# Patient Record
Sex: Male | Born: 1955 | Race: Black or African American | Hispanic: No | Marital: Single | Smoking: Current every day smoker
Health system: Southern US, Community
[De-identification: ages and names within clinical notes are randomized; demographics above are authoritative.]

## PROBLEM LIST (undated history)

## (undated) DIAGNOSIS — K4090 Unilateral inguinal hernia, without obstruction or gangrene, not specified as recurrent: Secondary | ICD-10-CM

## (undated) DIAGNOSIS — I10 Essential (primary) hypertension: Secondary | ICD-10-CM

## (undated) DIAGNOSIS — I429 Cardiomyopathy, unspecified: Secondary | ICD-10-CM

## (undated) DIAGNOSIS — Z959 Presence of cardiac and vascular implant and graft, unspecified: Secondary | ICD-10-CM

## (undated) DIAGNOSIS — Z720 Tobacco use: Secondary | ICD-10-CM

## (undated) HISTORY — PX: OTHER SURGICAL HISTORY: SHX169

---

## 2014-10-18 ENCOUNTER — Inpatient Hospital Stay (HOSPITAL_COMMUNITY)
Admission: EM | Admit: 2014-10-18 | Discharge: 2014-10-21 | DRG: 249 | Disposition: A | Discharge status: Home / Self Care | Payer: BLUE CROSS/BLUE SHIELD | Source: Other Acute Inpatient Hospital | Attending: Cardiology | Admitting: Cardiology

## 2014-10-18 ENCOUNTER — Encounter (HOSPITAL_COMMUNITY)
Admission: EM | Disposition: A | Discharge status: Home / Self Care | Payer: BLUE CROSS/BLUE SHIELD | Source: Other Acute Inpatient Hospital | Attending: Cardiology

## 2014-10-18 ENCOUNTER — Encounter (HOSPITAL_COMMUNITY): Payer: Self-pay | Admitting: Cardiology

## 2014-10-18 DIAGNOSIS — I214 Non-ST elevation (NSTEMI) myocardial infarction: Principal | ICD-10-CM | POA: Diagnosis present

## 2014-10-18 DIAGNOSIS — R079 Chest pain, unspecified: Secondary | ICD-10-CM

## 2014-10-18 DIAGNOSIS — T45515A Adverse effect of anticoagulants, initial encounter: Secondary | ICD-10-CM | POA: Diagnosis not present

## 2014-10-18 DIAGNOSIS — I2582 Chronic total occlusion of coronary artery: Secondary | ICD-10-CM | POA: Diagnosis present

## 2014-10-18 DIAGNOSIS — K4090 Unilateral inguinal hernia, without obstruction or gangrene, not specified as recurrent: Secondary | ICD-10-CM | POA: Diagnosis present

## 2014-10-18 DIAGNOSIS — I2510 Atherosclerotic heart disease of native coronary artery without angina pectoris: Secondary | ICD-10-CM | POA: Diagnosis present

## 2014-10-18 DIAGNOSIS — I10 Essential (primary) hypertension: Secondary | ICD-10-CM | POA: Diagnosis not present

## 2014-10-18 DIAGNOSIS — F17200 Nicotine dependence, unspecified, uncomplicated: Secondary | ICD-10-CM | POA: Diagnosis present

## 2014-10-18 DIAGNOSIS — I119 Hypertensive heart disease without heart failure: Secondary | ICD-10-CM | POA: Diagnosis present

## 2014-10-18 DIAGNOSIS — R0600 Dyspnea, unspecified: Secondary | ICD-10-CM | POA: Diagnosis not present

## 2014-10-18 DIAGNOSIS — I43 Cardiomyopathy in diseases classified elsewhere: Secondary | ICD-10-CM | POA: Diagnosis present

## 2014-10-18 DIAGNOSIS — Z959 Presence of cardiac and vascular implant and graft, unspecified: Secondary | ICD-10-CM

## 2014-10-18 DIAGNOSIS — Z9119 Patient's noncompliance with other medical treatment and regimen: Secondary | ICD-10-CM | POA: Diagnosis present

## 2014-10-18 DIAGNOSIS — Z8249 Family history of ischemic heart disease and other diseases of the circulatory system: Secondary | ICD-10-CM | POA: Diagnosis not present

## 2014-10-18 DIAGNOSIS — Z720 Tobacco use: Secondary | ICD-10-CM

## 2014-10-18 DIAGNOSIS — I340 Nonrheumatic mitral (valve) insufficiency: Secondary | ICD-10-CM | POA: Diagnosis not present

## 2014-10-18 DIAGNOSIS — I429 Cardiomyopathy, unspecified: Secondary | ICD-10-CM

## 2014-10-18 DIAGNOSIS — Z955 Presence of coronary angioplasty implant and graft: Secondary | ICD-10-CM

## 2014-10-18 HISTORY — DX: Unilateral inguinal hernia, without obstruction or gangrene, not specified as recurrent: K40.90

## 2014-10-18 HISTORY — DX: Essential (primary) hypertension: I10

## 2014-10-18 HISTORY — PX: LEFT HEART CATH: SHX5478

## 2014-10-18 HISTORY — DX: Tobacco use: Z72.0

## 2014-10-18 HISTORY — DX: Presence of cardiac and vascular implant and graft, unspecified: Z95.9

## 2014-10-18 HISTORY — DX: Cardiomyopathy, unspecified: I42.9

## 2014-10-18 LAB — CBC
HCT: 51 % (ref 39.0–52.0)
HEMOGLOBIN: 16.5 g/dL (ref 13.0–17.0)
MCH: 26.8 pg (ref 26.0–34.0)
MCHC: 32.4 g/dL (ref 30.0–36.0)
MCV: 82.9 fL (ref 78.0–100.0)
Platelets: 212 10*3/uL (ref 150–400)
RBC: 6.15 MIL/uL — ABNORMAL HIGH (ref 4.22–5.81)
RDW: 15.3 % (ref 11.5–15.5)
WBC: 12.9 10*3/uL — AB (ref 4.0–10.5)

## 2014-10-18 LAB — CREATININE, SERUM
CREATININE: 1.32 mg/dL (ref 0.50–1.35)
GFR calc non Af Amer: 58 mL/min — ABNORMAL LOW (ref >90)
GFR, EST AFRICAN AMERICAN: 67 mL/min — AB (ref >90)

## 2014-10-18 LAB — MRSA PCR SCREENING: MRSA by PCR: NEGATIVE

## 2014-10-18 LAB — MAGNESIUM: Magnesium: 1.8 mg/dL (ref 1.5–2.5)

## 2014-10-18 LAB — TSH: TSH: 2.106 u[IU]/mL (ref 0.350–4.500)

## 2014-10-18 SURGERY — LEFT HEART CATH

## 2014-10-18 MED ORDER — MORPHINE SULFATE 2 MG/ML IJ SOLN
INTRAMUSCULAR | Status: AC
  Filled 2014-10-18: qty 1

## 2014-10-18 MED ORDER — SODIUM CHLORIDE 0.9 % IV SOLN
INTRAVENOUS | Status: AC
  Administered 2014-10-18: 20:00:00 via INTRAVENOUS

## 2014-10-18 MED ORDER — BIVALIRUDIN 250 MG IV SOLR
INTRAVENOUS | Status: AC
  Filled 2014-10-18: qty 250

## 2014-10-18 MED ORDER — FENTANYL CITRATE (PF) 100 MCG/2ML IJ SOLN
INTRAMUSCULAR | Status: AC
  Filled 2014-10-18: qty 2

## 2014-10-18 MED ORDER — ASPIRIN 81 MG PO CHEW
81.0000 mg | CHEWABLE_TABLET | Freq: Every day | ORAL | Status: DC
  Administered 2014-10-19 – 2014-10-21 (×3): 81 mg via ORAL
  Filled 2014-10-18 (×3): qty 1

## 2014-10-18 MED ORDER — HEPARIN (PORCINE) IN NACL 2-0.9 UNIT/ML-% IJ SOLN
INTRAMUSCULAR | Status: AC
  Filled 2014-10-18: qty 1000

## 2014-10-18 MED ORDER — FUROSEMIDE 10 MG/ML IJ SOLN
40.0000 mg | Freq: Two times a day (BID) | INTRAMUSCULAR | Status: DC
  Administered 2014-10-18: 40 mg via INTRAVENOUS
  Filled 2014-10-18 (×4): qty 4

## 2014-10-18 MED ORDER — TICAGRELOR 90 MG PO TABS
ORAL_TABLET | ORAL | Status: AC
  Filled 2014-10-18: qty 1

## 2014-10-18 MED ORDER — NITROGLYCERIN IN D5W 200-5 MCG/ML-% IV SOLN
0.0000 ug/min | INTRAVENOUS | Status: DC
Start: 2014-10-19 — End: 2014-10-20
  Administered 2014-10-19: 5 ug/min via INTRAVENOUS

## 2014-10-18 MED ORDER — ALPRAZOLAM 0.5 MG PO TABS
0.5000 mg | ORAL_TABLET | Freq: Once | ORAL | Status: AC
  Administered 2014-10-19: 0.5 mg via ORAL
  Filled 2014-10-18: qty 1

## 2014-10-18 MED ORDER — MIDAZOLAM HCL 2 MG/2ML IJ SOLN
INTRAMUSCULAR | Status: AC
  Filled 2014-10-18: qty 2

## 2014-10-18 MED ORDER — HEPARIN SODIUM (PORCINE) 5000 UNIT/ML IJ SOLN
5000.0000 [IU] | Freq: Three times a day (TID) | INTRAMUSCULAR | Status: DC
  Administered 2014-10-19 – 2014-10-21 (×7): 5000 [IU] via SUBCUTANEOUS
  Filled 2014-10-18 (×10): qty 1

## 2014-10-18 MED ORDER — MORPHINE SULFATE 2 MG/ML IJ SOLN
2.0000 mg | Freq: Once | INTRAMUSCULAR | Status: AC
  Administered 2014-10-18: 2 mg via INTRAVENOUS

## 2014-10-18 MED ORDER — LIDOCAINE HCL (PF) 1 % IJ SOLN
INTRAMUSCULAR | Status: AC
  Filled 2014-10-18: qty 30

## 2014-10-18 MED ORDER — ONDANSETRON HCL 4 MG/2ML IJ SOLN: 4.0000 mg | Freq: Four times a day (QID) | INTRAMUSCULAR | Status: DC | PRN

## 2014-10-18 MED ORDER — TICAGRELOR 90 MG PO TABS
90.0000 mg | ORAL_TABLET | Freq: Two times a day (BID) | ORAL | Status: DC
  Administered 2014-10-18 – 2014-10-20 (×4): 90 mg via ORAL
  Filled 2014-10-18 (×5): qty 1

## 2014-10-18 MED ORDER — POTASSIUM CHLORIDE CRYS ER 20 MEQ PO TBCR
20.0000 meq | EXTENDED_RELEASE_TABLET | Freq: Once | ORAL | Status: AC
  Administered 2014-10-18: 20 meq via ORAL
  Filled 2014-10-18: qty 1

## 2014-10-18 MED ORDER — MORPHINE SULFATE 2 MG/ML IJ SOLN
2.0000 mg | INTRAMUSCULAR | Status: DC | PRN
Start: 2014-10-18 — End: 2014-10-21
  Administered 2014-10-18 – 2014-10-19 (×5): 2 mg via INTRAVENOUS
  Administered 2014-10-19: 4 mg via INTRAVENOUS
  Administered 2014-10-20 – 2014-10-21 (×4): 2 mg via INTRAVENOUS
  Filled 2014-10-18: qty 2
  Filled 2014-10-18 (×9): qty 1

## 2014-10-18 MED ORDER — VERAPAMIL HCL 2.5 MG/ML IV SOLN
INTRAVENOUS | Status: AC
  Filled 2014-10-18: qty 2

## 2014-10-18 MED ORDER — HYDRALAZINE HCL 25 MG PO TABS
25.0000 mg | ORAL_TABLET | Freq: Three times a day (TID) | ORAL | Status: DC
  Administered 2014-10-18 – 2014-10-21 (×8): 25 mg via ORAL
  Filled 2014-10-18 (×12): qty 1

## 2014-10-18 MED ORDER — ACETAMINOPHEN 325 MG PO TABS
650.0000 mg | ORAL_TABLET | ORAL | Status: DC | PRN
  Administered 2014-10-18 – 2014-10-21 (×2): 650 mg via ORAL
  Filled 2014-10-18 (×2): qty 2

## 2014-10-18 MED ORDER — CARVEDILOL 3.125 MG PO TABS
3.1250 mg | ORAL_TABLET | Freq: Two times a day (BID) | ORAL | Status: DC
  Administered 2014-10-18 – 2014-10-19 (×2): 3.125 mg via ORAL
  Filled 2014-10-18 (×4): qty 1

## 2014-10-18 MED ORDER — NICOTINE 14 MG/24HR TD PT24
14.0000 mg | MEDICATED_PATCH | Freq: Every day | TRANSDERMAL | Status: DC
  Administered 2014-10-18 – 2014-10-21 (×4): 14 mg via TRANSDERMAL
  Filled 2014-10-18 (×4): qty 1

## 2014-10-18 MED ORDER — NITROGLYCERIN 0.4 MG SL SUBL
SUBLINGUAL_TABLET | SUBLINGUAL | Status: AC
  Filled 2014-10-18: qty 1

## 2014-10-18 MED ORDER — NITROGLYCERIN 0.4 MG SL SUBL
0.4000 mg | SUBLINGUAL_TABLET | SUBLINGUAL | Status: DC | PRN
  Administered 2014-10-18 (×2): 0.4 mg via SUBLINGUAL
  Filled 2014-10-18: qty 1

## 2014-10-18 MED ORDER — NITROGLYCERIN 1 MG/10 ML FOR IR/CATH LAB
INTRA_ARTERIAL | Status: AC
  Filled 2014-10-18: qty 10

## 2014-10-18 MED ORDER — ATORVASTATIN CALCIUM 80 MG PO TABS
80.0000 mg | ORAL_TABLET | Freq: Every day | ORAL | Status: DC
  Administered 2014-10-19 – 2014-10-20 (×2): 80 mg via ORAL
  Filled 2014-10-18 (×3): qty 1

## 2014-10-18 MED ORDER — ISOSORBIDE MONONITRATE ER 30 MG PO TB24
30.0000 mg | ORAL_TABLET | Freq: Every day | ORAL | Status: DC
  Administered 2014-10-18: 30 mg via ORAL
  Filled 2014-10-18 (×2): qty 1

## 2014-10-18 NOTE — CV Procedure (Signed)
    Cardiac Catheterization Procedure Note  Name: [place] ConstableShelby Gohlke MRN: [id number] DOB: 09/20/1955  Procedure: Left Heart Cath, Selective Coronary Angiography, LV angiography, PTCA and stenting of the mid RCA  Indication: 59 yo BM [facility] with acute chest pain and NSTEMI (originally called a STEMI by ED). Patient has a history of tobacco abuse and untreated HTN.  Procedural Details:  The right wrist was prepped, draped, and anesthetized with 1% lidocaine. Using the modified Seldinger technique, a 6 French slender sheath was introduced into the right radial artery. 3 mg of verapamil was administered through the sheath, weight-based unfractionated heparin was administered intravenously. Standard Judkins catheters were used for selective coronary angiography and left ventriculography. Catheter exchanges were performed over an exchange length guidewire.  PROCEDURAL FINDINGS Hemodynamics: AO 151/103 mean 125 mm Hg LV 143/37 mm Hg   Coronary angiography: Coronary dominance: right  Left mainstem: 20% diffuse disease.   Left anterior descending (LAD): There is 20-30% disease in the proximal vessel. The first diagonal has a high take off and is occluded with late left to left collaterals.   Left circumflex (LCx): Diffuse irregularities in the proximal to mid vessel up to 30%.   Right coronary artery (RCA): Dominant vessel. There is a segmental 95% stenosis in the mid vessel with TIMI2 flow.   Left ventriculography: Left ventricular systolic function is abnormal. There is akinesis in the mid anterior and basal to mid inferior wall. There is severe global hypokinesis.  LVEF is estimated at 25%, there is no significant mitral regurgitation   PCI Note:  Following the diagnostic procedure, the decision was made to proceed with PCI of the RCA.  Weight-based bivalirudin was given for anticoagulation. Brilinta 180 mg was given orally. Once a therapeutic ACT was achieved, a 6 [place]French  FR4 guide catheter was inserted.  A prowater coronary guidewire was used to cross the lesion.  The lesion was predilated with a 2.5 mm balloon.  The lesion was then stented with a 3.0 x 23 mm Vision stent.  The stent was postdilated with a 3.25 mm noncompliant balloon.  Following PCI, there was 0% residual stenosis and TIMI-3 flow. Final angiography confirmed an excellent result. The patient tolerated the procedure well. There were no immediate procedural complications. A TR band was used for radial hemostasis. The patient was transferred to the post catheterization recovery area for further monitoring.  PCI Data: Vessel - RCA/Segment - mid  Percent Stenosis (pre)  95% TIMI-flow 2 Stent 3.0 x 23 mm Vision Percent Stenosis (post) 0% TIMI-flow (post) 3  Final Conclusions:   1. 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 There is an acute lesion in the mid RCA. 2. Severe LV dysfunction. LV dysfunction is out of proportion to his CAD and suggests a component of hypertensive cardiomyopathy. 3. Successful stenting of the mid RCA with BMS.   Recommendations:  DAPT for one year. Needs aggressive risk factor modification and BP control. Will start beta blocker, diuretics, hydralazine and nitrates. Hold ACEi until renal function stable.   Peter SwazilandJordan, MDFACC 10/18/2014, 7:15 PM

## 2014-10-18 NOTE — Progress Notes (Signed)
Pt's daughter's number is [phone] --Tildon HuskyShakera

## 2014-10-18 NOTE — Progress Notes (Addendum)
Brief X-Cover Note  Mr Steve Jackson with CP - resolved with SL NTG and 2 mg Morphine. ECG reviewed and stable with RAD and lateral ST depression. Will continue to follow. Discussed with Mr. Steve Jackson in person. Vitals reviewed; BP 136/87 mmHg  Pulse 78  Temp(Src) 98.3 F (36.8 C) (Oral)  Resp 23  Ht 5\' 9"  (1.753 m)  Wt 94.1 kg (207 lb 7.3 oz)  BMI 30.62 kg/m2  SpO2 95%RRR, Lungs fairly CTAB.   Leeann MustJacob Gerry Heaphy, MD  Mr. Steve Jackson has had two repeat episodes of CP - second episode shortly after first that resolved with NTG gtt, morphine and sleep. He woke up at 5:xx with some chest discomfort, repeat ECG unchanged, improved with morphine, NTG. Will discuss with oncoming team consideration for re-look cath based on cath film review.   Leeann MustJacob Afua Hoots, MD

## 2014-10-18 NOTE — H&P (Signed)
         Steve ConstableShelby Jackson is an 59 y.o. male.    Primary Cardiologist:: [facility] Dr. SwazilandJordan No PCP Per Patient  Chief Complaint: chest pain HPI:  59 year old male with hx of HTN not taking meds due to side effects + tobacco use and no prior cardiac disease [facility] 3 days of intermittent chest pain. Pain lat ant chest pain.   Today the pain was worse no nausea, SOB or diaphoresis.   EKG was mildly abnormal.  [facility].      Past Medical History  Diagnosis Date  . HTN (hypertension) 10/18/2014  . Tobacco use 10/18/2014  . Hernia, inguinal 10/18/2014    Past Surgical History  Procedure Laterality Date  . Inguinla hernia      repair on Rt.    Family History  Problem Relation Age of Onset  . Heart disease Mother    Social History:  reports that he has been smoking.  He has never used smokeless tobacco. He reports that he does not drink alcohol or use illicit drugs.  Allergies: Not on File  No prescriptions prior to admission  NO OUTPATIENT MEDICATIONS:  No results found for this or any previous visit (from the past 48 hour(s)). No results found.  ROS: General:no colds or fevers, no weight changes Skin:no rashes or ulcers HEENT:no blurred vision, no congestion CV:see HPI PUL:see HPI GI:no diarrhea constipation or melena, no indigestion, + inguinal hernia GU:no hematuria, no dysuria MS:no joint pain, no claudication Neuro:no syncope, no lightheadedness Endo:no diabetes, no thyroid disease   There were no vitals taken for this visit. PE: General:Pleasant affect, NAD, anxious Skin:Warm and dry, brisk capillary refill HEENT:normocephalic, sclera injected, mucus membranes moist Neck:supple, no JVD, no bruits  Heart:S1S2 RRR without murmur, gallup, rub or click Lungs:clear, ant.  without rales, rhonchi, or wheezes WUJ:WJXBAbd:soft, non tender, + BS, do not palpate liver spleen or masses, + inguinal hernia Ext:no lower  ext edema, 2+ pedal pulses, 2+ radial pulses, large Lt inguinal hernia into thigh.  Hx rt ing. Hernia repair.  Neuro:alert and oriented, MAE, follows commands, + facial symmetry    Assessment/Plan Principal Problem:   NSTEMI- urgent cath with ongoing pain  Active Problems:   HTN (hypertension)   Tobacco use   Hernia, inguinal- Lt Abnormal LFTs     End[facility] Services PaNGOLD,LAURA R Nurse Practitioner Certified [facility] Pager [phone] or after 5pm or weekends call [phone] 10/18/2014, 7:16 PM     Patient seen and examined and history reviewed. Agree with above findings and plan. 59 yo BM [facility] for what the ED called a STEMI. Review of Ecg showed more LVH but troponin came back at 23. Patient has been having chest pain off and on for 3 days. Developed severe pain at work this am. Now pain free. Has a history of untreated HTN and tobacco abuse. Does not go to a doctor. On exam he has a very large left inguinal hernia extending into scrotum. Lungs clear anterior. Positive S4. Labs remarkable for elevated creatinine of 1.46 and elevated transaminases. Will proceed with emergent cardiac cath with possible PCI. If PCI needed will plan to use BMS due to compliance issues. Recommend statin, BP control, and tobacco cessation.  Steve Jackson, MDFACC 10/18/2014 7:37 PM

## 2014-10-18 NOTE — Progress Notes (Signed)
CP returned worse than before. Pain worse with inspiration. 7/10 with pain radiating into L shoulder. Pt clammy. Repeat EKG completed. MD notified. New orders received. Will continue to monitor.

## 2014-10-19 ENCOUNTER — Encounter (HOSPITAL_COMMUNITY): Payer: Self-pay | Admitting: Cardiology

## 2014-10-19 DIAGNOSIS — I340 Nonrheumatic mitral (valve) insufficiency: Secondary | ICD-10-CM

## 2014-10-19 LAB — BASIC METABOLIC PANEL
Anion gap: 10 (ref 5–15)
BUN: 17 mg/dL (ref 6–23)
CO2: 25 mmol/L (ref 19–32)
CREATININE: 1.3 mg/dL (ref 0.50–1.35)
Calcium: 8.3 mg/dL — ABNORMAL LOW (ref 8.4–10.5)
Chloride: 101 mmol/L (ref 96–112)
GFR calc Af Amer: 68 mL/min — ABNORMAL LOW (ref >90)
GFR, EST NON AFRICAN AMERICAN: 59 mL/min — AB (ref >90)
Glucose, Bld: 122 mg/dL — ABNORMAL HIGH (ref 70–99)
Potassium: 4.8 mmol/L (ref 3.5–5.1)
Sodium: 136 mmol/L (ref 135–145)

## 2014-10-19 LAB — CBC
HEMATOCRIT: 51.7 % (ref 39.0–52.0)
Hemoglobin: 16.9 g/dL (ref 13.0–17.0)
MCH: 27.7 pg (ref 26.0–34.0)
MCHC: 32.7 g/dL (ref 30.0–36.0)
MCV: 84.6 fL (ref 78.0–100.0)
PLATELETS: 198 10*3/uL (ref 150–400)
RBC: 6.11 MIL/uL — ABNORMAL HIGH (ref 4.22–5.81)
RDW: 15.6 % — ABNORMAL HIGH (ref 11.5–15.5)
WBC: 11.7 10*3/uL — ABNORMAL HIGH (ref 4.0–10.5)

## 2014-10-19 LAB — HEPATIC FUNCTION PANEL
ALK PHOS: 68 U/L (ref 39–117)
ALT: 31 U/L (ref 0–53)
AST: 89 U/L — ABNORMAL HIGH (ref 0–37)
Albumin: 2.4 g/dL — ABNORMAL LOW (ref 3.5–5.2)
Bilirubin, Direct: 0.5 mg/dL (ref 0.0–0.5)
Indirect Bilirubin: 1.4 mg/dL — ABNORMAL HIGH (ref 0.3–0.9)
TOTAL PROTEIN: 6.9 g/dL (ref 6.0–8.3)
Total Bilirubin: 1.9 mg/dL — ABNORMAL HIGH (ref 0.3–1.2)

## 2014-10-19 LAB — POCT ACTIVATED CLOTTING TIME: Activated Clotting Time: 331 seconds

## 2014-10-19 MED ORDER — TRAMADOL HCL 50 MG PO TABS
50.0000 mg | ORAL_TABLET | Freq: Four times a day (QID) | ORAL | Status: DC | PRN
  Administered 2014-10-19 (×2): 50 mg via ORAL
  Filled 2014-10-19 (×2): qty 1

## 2014-10-19 MED ORDER — SODIUM CHLORIDE 0.9 % IV SOLN: INTRAVENOUS | Status: DC

## 2014-10-19 MED ORDER — FUROSEMIDE 40 MG PO TABS
40.0000 mg | ORAL_TABLET | Freq: Two times a day (BID) | ORAL | Status: DC
  Administered 2014-10-19 (×2): 40 mg via ORAL
  Filled 2014-10-19 (×4): qty 1

## 2014-10-19 MED ORDER — LOSARTAN POTASSIUM 25 MG PO TABS
25.0000 mg | ORAL_TABLET | Freq: Every day | ORAL | Status: DC
  Administered 2014-10-19 – 2014-10-21 (×3): 25 mg via ORAL
  Filled 2014-10-19 (×3): qty 1

## 2014-10-19 MED ORDER — ISOSORBIDE MONONITRATE ER 60 MG PO TB24
60.0000 mg | ORAL_TABLET | Freq: Every day | ORAL | Status: DC
  Administered 2014-10-19 – 2014-10-21 (×3): 60 mg via ORAL
  Filled 2014-10-19 (×3): qty 1

## 2014-10-19 MED ORDER — NITROGLYCERIN IN D5W 200-5 MCG/ML-% IV SOLN
INTRAVENOUS | Status: AC
  Filled 2014-10-19: qty 250

## 2014-10-19 MED ORDER — CARVEDILOL 6.25 MG PO TABS
6.2500 mg | ORAL_TABLET | Freq: Two times a day (BID) | ORAL | Status: DC
  Administered 2014-10-19 – 2014-10-21 (×4): 6.25 mg via ORAL
  Filled 2014-10-19 (×6): qty 1

## 2014-10-19 MED FILL — Sodium Chloride IV Soln 0.9%: INTRAVENOUS | Qty: 50 | Status: AC

## 2014-10-19 NOTE — Care Management Note (Signed)
    Page 1 of 1   10/19/2014     10:07:36 AM CARE MANAGEMENT NOTE 10/19/2014  Patient:  Steve Jackson,Steve Jackson   Account Number:  [id number]  Date Initiated:  10/19/2014  Documentation initiated by:  Junius CreamerWELL,DEBBIE  Subjective/Objective Assessment:   adm w nstemi     Action/Plan:   lives w fam   Anticipated DC Date:     Anticipated DC Plan:  HOME/SELF CARE      DC Planning Services  CM consult  Medication Assistance      Choice offered to / List presented to:             Status of service:   Medicare Important Message given?   (If response is "NO", the following Medicare IM given date fields will be blank) Date Medicare IM given:   Medicare IM given by:   Date Additional Medicare IM given:   Additional Medicare IM given by:    Discharge Disposition:    Per UR Regulation:  Reviewed for med. necessity/level of care/duration of stay  If discussed at Long Length of Stay Meetings, dates discussed:    Comments:  4/26 1006 debbie Don Tiu rn,bsn spoke w pt and gave him 30day free and copay assist card for brilinta. bcbs listed as ins.

## 2014-10-19 NOTE — Progress Notes (Signed)
Patient Name: Steve ConstableShelby Jackson Date of Encounter: 10/19/2014     Principal Problem:   NSTEMI (non-ST elevated myocardial infarction) Active Problems:   HTN (hypertension)   Tobacco use   Hernia, inguinal   Cardiomyopathy, EF 25% at cath   S/P arterial stent, BMS to RCA, 10/18/14   CAD, multiple vessel    SUBJECTIVE  Patient had chest pain during the night which improved after IV morphine and increased IV nitroglycerin drip and alprazolam.  This morning he is free of chest pain except point he takes a deep breath.  Blood pressure is normal and he is not short of breath.  [facility] trop[facility].  CURRENT MEDS . aspirin  81 mg Oral Daily  . atorvastatin  80 mg Oral q1800  . carvedilol  3.125 mg Oral BID WC  . heparin  5,000 Units Subcutaneous 3 times per day  . hydrALAZINE  25 mg Oral 3 times per day  . isosorbide mononitrate  60 mg Oral Daily  . nicotine  14 mg Transdermal Daily  . ticagrelor  90 mg Oral BID    OBJECTIVE  Filed Vitals:   10/19/14 0630 10/19/14 0700 10/19/14 0715 10/19/14 0730  BP: 120/78 121/76 128/90 126/74  Pulse: 81 81 85 80  Temp:      TempSrc:      Resp: 26 24 22 31   Height:      Weight:      SpO2: 95% 92% 95% 94%    Intake/Output Summary (Last 24 hours) at 10/19/14 0740 Last data filed at 10/19/14 0600  Gross per 24 hour  Intake 786.13 ml  Output   2050 ml  Net -1263.87 ml   Filed Weights   10/18/14 2000  Weight: 207 lb 7.3 oz (94.1 kg)    PHYSICAL EXAM  General: Pleasant, NAD. Neuro: Alert and oriented X 3. Moves all extremities spontaneously. Psych: Normal affect. HEENT:  Normal  Neck: Supple without bruits or JVD. Lungs:  Resp regular and unlabored, CTA. Heart: RRR no s3, s4, or murmurs. Abdomen: Soft, non-tender, non-distended, BS + x 4.  Extremities: No clubbing, cyanosis or edema. DP/PT/Radials 2+ and equal bilaterally.  Accessory Clinical Findings  CBC  Recent Labs  10/18/14 2053 10/19/14 0222    WBC 12.9* 11.7*  HGB 16.5 16.9  HCT 51.0 51.7  MCV 82.9 84.6  PLT 212 198   Basic Metabolic Panel  Recent Labs  10/18/14 2053 10/19/14 0222  NA  --  136  K  --  4.8  CL  --  101  CO2  --  25  GLUCOSE  --  122*  BUN  --  17  CREATININE 1.32 1.30  CALCIUM  --  8.3*  MG 1.8  --    Liver Function Tests  Recent Labs  10/19/14 0222  AST 89*  ALT 31  ALKPHOS 68  BILITOT 1.9*  PROT 6.9  ALBUMIN 2.4*   No results for input(s): LIPASE, AMYLASE in the last 72 hours. Cardiac Enzymes No results for input(s): CKTOTAL, CKMB, CKMBINDEX, TROPONINI in the last 72 hours. BNP Invalid input(s): POCBNP D-Dimer No results for input(s): DDIMER in the last 72 hours. Hemoglobin A1C No results for input(s): HGBA1C in the last 72 hours. Fasting Lipid Panel No results for input(s): CHOL, HDL, LDLCALC, TRIG, CHOLHDL, LDLDIRECT in the last 72 hours. Thyroid Function Tests  Recent Labs  10/18/14 2053  TSH 2.106    TELE  Normal sinus rhythm  ECG  19-Oct-2014 05:11:21 [facility] ROUTINE RECORD Sinus rhythm with short PR Rightward axis Cannot rule out Inferior infarct , age undetermined T wave abnormality, consider lateral ischemia Abnormal ECG Personally reviewed Radiology/Studies  No results found.  ASSESSMENT AND PLAN  1.  Non-STEMI with significant troponin elevation of 23, status post stenting of the mid" artery with a bare metal stent. 2.  Left ventricular systolic dysfunction which is out of proportion to his coronary disease and suggests a component of hypertensive cardiomyopathy. 3.  History of poor compliance in the past  Plan: We will try to wean his IV nitroglycerin today and put him on oral nitrates.  We will switch him to oral Lasix.  We will add losartan 25 mg daily. We will increase activity today.  He will be getting an echocardiogram today.  Anticipate possible discharge Wednesday if stable.  Signed, Cassell Clement MD

## 2014-10-19 NOTE — Progress Notes (Signed)
Pt c/o of worsening CP 6/10. Hurts with inspiration. Pt restless and clammy. Repeat EKG. MD notified. New orders received. MD coming to bedside. Will continue to monior.

## 2014-10-19 NOTE — Progress Notes (Signed)
Pt complaining of chest pain throughout the day only during deep inspiration. Morphine prn given this AM and MD notified MD stated thinks this is respiratory and not cardiac and ordered to continue weaning nitro gtt. MD at bedside dueing AM rounds and ordered Tramadol 50 mg PO to ease respiratory pain. Tramadol given to pt some ease of pain. Complained of more pain throughout the day Morphine given, repositioned, and MD aware. Morphine resolved pain at this time. Will continue to assess and monitor the pt closely.

## 2014-10-19 NOTE — Progress Notes (Signed)
CARDIAC REHAB PHASE I   PRE:  Rate/Rhythm: 85 SR  BP:  Sitting: 126/84        SaO2: 90 RA  MODE:  Ambulation: 350 ft   POST:  Rate/Rhythm: 86 SR  BP:  Sitting: 133/83         SaO2: 95 4L  Pt ambulated 350 ft on 4L O2, IV, handheld assist, steady gait, tolerated fair.  Pt c/o of moderate DOE, denies cp, dizziness, declined rest stop. Began MI/stent education.  Gave pt MI book. Reviewed anti-platelet therapy, activity restrictions, gave pt heart healthy diet, tobacco cessation, sodium restriction handout and heart failure book. Pt verbalized understanding. Pt states he will quit smoking, even though his girlfriend smokes.  Pt does state he drinks "a 40 of beer" each day. Pt advised to severely limit alcohol intake/quit using alcohol. Pt sitting on side of bed during education, O2 88-91 on 4L O2. Pt states "it hurts to take a deep breath." Pt c/o 2/10 chest pain. RN notified. Pt sitting in bed with call bell within reach.  10:15-11:15   Joylene GrapesMonge, Chyanna Flock C, RN, BSN 10/19/2014 11:10 AM

## 2014-10-19 NOTE — Progress Notes (Signed)
Pt awoke with worsening CP. States it is the worst it has been all night 8-9/10. States it is sharp pain with inhalation. EKG completed. MD notified. New orders received. Will continue to monitor.

## 2014-10-19 NOTE — Progress Notes (Signed)
  Echocardiogram 2D Echocardiogram has been performed.  Steve Jackson, Steve Jackson A 10/19/2014, 4:26 PM

## 2014-10-20 ENCOUNTER — Inpatient Hospital Stay (HOSPITAL_COMMUNITY): Payer: BLUE CROSS/BLUE SHIELD

## 2014-10-20 LAB — BASIC METABOLIC PANEL
Anion gap: 11 (ref 5–15)
BUN: 15 mg/dL (ref 6–23)
CHLORIDE: 97 mmol/L (ref 96–112)
CO2: 27 mmol/L (ref 19–32)
CREATININE: 1.32 mg/dL (ref 0.50–1.35)
Calcium: 8.3 mg/dL — ABNORMAL LOW (ref 8.4–10.5)
GFR calc Af Amer: 67 mL/min — ABNORMAL LOW (ref >90)
GFR calc non Af Amer: 58 mL/min — ABNORMAL LOW (ref >90)
Glucose, Bld: 91 mg/dL (ref 70–99)
Potassium: 3.5 mmol/L (ref 3.5–5.1)
Sodium: 135 mmol/L (ref 135–145)

## 2014-10-20 LAB — HEMOGLOBIN A1C
Hgb A1c MFr Bld: 5.6 % (ref 4.8–5.6)
MEAN PLASMA GLUCOSE: 114 mg/dL

## 2014-10-20 MED ORDER — GI COCKTAIL ~~LOC~~
30.0000 mL | Freq: Once | ORAL | Status: AC
  Administered 2014-10-20: 30 mL via ORAL
  Filled 2014-10-20: qty 30

## 2014-10-20 MED ORDER — FUROSEMIDE 40 MG PO TABS
40.0000 mg | ORAL_TABLET | Freq: Every day | ORAL | Status: DC
  Administered 2014-10-20: 40 mg via ORAL
  Filled 2014-10-20: qty 1

## 2014-10-20 MED ORDER — CLOPIDOGREL BISULFATE 75 MG PO TABS
75.0000 mg | ORAL_TABLET | Freq: Every day | ORAL | Status: DC
  Administered 2014-10-21: 75 mg via ORAL
  Filled 2014-10-20: qty 1

## 2014-10-20 MED ORDER — PANTOPRAZOLE SODIUM 40 MG PO TBEC
40.0000 mg | DELAYED_RELEASE_TABLET | Freq: Every day | ORAL | Status: DC
  Administered 2014-10-20: 40 mg via ORAL
  Filled 2014-10-20: qty 1

## 2014-10-20 MED ORDER — PNEUMOCOCCAL VAC POLYVALENT 25 MCG/0.5ML IJ INJ
0.5000 mL | INJECTION | INTRAMUSCULAR | Status: AC
  Administered 2014-10-21: 0.5 mL via INTRAMUSCULAR
  Filled 2014-10-20: qty 0.5

## 2014-10-20 MED ORDER — POTASSIUM CHLORIDE CRYS ER 20 MEQ PO TBCR
20.0000 meq | EXTENDED_RELEASE_TABLET | Freq: Once | ORAL | Status: AC
  Administered 2014-10-20: 20 meq via ORAL
  Filled 2014-10-20: qty 1

## 2014-10-20 MED ORDER — CLOPIDOGREL BISULFATE 300 MG PO TABS
300.0000 mg | ORAL_TABLET | Freq: Once | ORAL | Status: AC
  Administered 2014-10-20: 300 mg via ORAL
  Filled 2014-10-20: qty 1

## 2014-10-20 NOTE — Progress Notes (Addendum)
CARDIAC REHAB PHASE I   PRE:  Rate/Rhythm:79 SR  BP:  Sitting: 104/62        SaO2: 98 4L  MODE:  Ambulation: 350 ft   POST:  Rate/Rhythm: 78 SR  BP:  Sitting: 78 SR         SaO2: 98 2L  Pt ambulated 350 ft on 2 L O2, handheld assist, steady gait, tolerated fair.  Pt c/o of mild DOE, denies cp, does c/o L shoulder pain 4/10 aching, denies dizziness. Standing rest x1. Pt O2 during ambulation 98 on 2L. Upon returing to room, pt continued to c/o L shoulder pain and 4/10 chest pressure. RN notified, pt received IV morphine. Pt reports relief with IV morphine. Completed stent education.  Reviewed anti-platelet therapy, activity restrictions, ntg, exercise gl, tobacco cessation (pt states he is ready to quit), heart healthy diet, sodium restrictions, portion control, heart failure education and phase 2 cardiac rehab. Pt verbalized understanding. Pt agrees to phase 2 cardiac rehab. [facility]    [id number]  Joylene GrapesMonge, Jamica Woodyard C, RN, BSN 10/20/2014 11:46 AM

## 2014-10-20 NOTE — Progress Notes (Signed)
Patient Name: Steve Jackson Date of Encounter: 10/20/2014     Principal Problem:   NSTEMI (non-ST elevated myocardial infarction) Active Problems:   HTN (hypertension)   Tobacco use   Hernia, inguinal   Cardiomyopathy, EF 25% at cath   S/P arterial stent, BMS to RCA, 10/18/14   CAD, multiple vessel    SUBJECTIVE the patient continues to have severe substernal chest pain when he takes a deep breath.  He is not having chest pain at rest otherwise.  He does not think he is ready to go home from the hospital yet.  His echocardiogram yesterday did not show any evidence of pericarditis or pericardial effusion.  He has significant left ventricular systolic dysfunction.  His troponin at [facility] was 23.  CURRENT MEDS . aspirin  81 mg Oral Daily  . atorvastatin  80 mg Oral q1800  . carvedilol  6.25 mg Oral BID WC  . furosemide  40 mg Oral BID  . heparin  5,000 Units Subcutaneous 3 times per day  . hydrALAZINE  25 mg Oral 3 times per day  . isosorbide mononitrate  60 mg Oral Daily  . losartan  25 mg Oral Daily  . nicotine  14 mg Transdermal Daily  . ticagrelor  90 mg Oral BID    OBJECTIVE  Filed Vitals:   10/19/14 2200 10/20/14 0000 10/20/14 0200 10/20/14 0741  BP: 121/77 95/51 101/67   Pulse: 87 80 84   Temp:  97.8 F (36.6 C)  98.2 F (36.8 C)  TempSrc:  Oral  Oral  Resp:  9 26   Height:      Weight:      SpO2: 93% 95% 96%     Intake/Output Summary (Last 24 hours) at 10/20/14 0745 Last data filed at 10/20/14 0300  Gross per 24 hour  Intake 692.42 ml  Output    600 ml  Net  92.42 ml   Filed Weights   10/18/14 2000  Weight: 207 lb 7.3 oz (94.1 kg)    PHYSICAL EXAM  General: Pleasant, NAD. Neuro: Alert and oriented X 3. Moves all extremities spontaneously. Psych: Normal affect. HEENT:  Normal  Neck: Supple without bruits or JVD. Lungs:  Resp regular and unlabored, CTA. Heart: RRR no s3, s4, or murmurs.  Auscultation reveals no pericardial rub.  He  is not tender to palpation Abdomen: Soft, non-tender, non-distended, BS + x 4.  Extremities: No clubbing, cyanosis or edema. DP/PT/Radials 2+ and equal bilaterally.  Accessory Clinical Findings  CBC  Recent Labs  10/18/14 2053 10/19/14 0222  WBC 12.9* 11.7*  HGB 16.5 16.9  HCT 51.0 51.7  MCV 82.9 84.6  PLT 212 198   Basic Metabolic Panel  Recent Labs  10/18/14 2053 10/19/14 0222 10/20/14 0246  NA  --  136 135  K  --  4.8 3.5  CL  --  101 97  CO2  --  25 27  GLUCOSE  --  122* 91  BUN  --  17 15  CREATININE 1.32 1.30 1.32  CALCIUM  --  8.3* 8.3*  MG 1.8  --   --    Liver Function Tests  Recent Labs  10/19/14 0222  AST 89*  ALT 31  ALKPHOS 68  BILITOT 1.9*  PROT 6.9  ALBUMIN 2.4*   No results for input(s): LIPASE, AMYLASE in the last 72 hours. Cardiac Enzymes No results for input(s): CKTOTAL, CKMB, CKMBINDEX, TROPONINI in the last 72 hours. BNP Invalid input(s): POCBNP D-Dimer  No results for input(s): DDIMER in the last 72 hours. Hemoglobin A1C No results for input(s): HGBA1C in the last 72 hours. Fasting Lipid Panel No results for input(s): CHOL, HDL, LDLCALC, TRIG, CHOLHDL, LDLDIRECT in the last 72 hours. Thyroid Function Tests  Recent Labs  10/18/14 2053  TSH 2.106    TELE  Normal sinus rhythm  ECG 20-Oct-2014 06:32:47  [facility] ROUT[facility] RECORD Normal sinus rhythm Rightward axis Cannot rule out Inferior infarct , age undetermined Abnormal ECG  Radiology/Studies  No results found.  ASSESSMENT AND PLAN  1.  Non-STEMI with significant troponin elevation of 23, status post stenting of the mid" artery with a bare metal stent. 2.  Left ventricular systolic dysfunction which is out of proportion to his coronary disease and suggests a component of hypertensive cardiomyopathy. 3.  History of poor compliance in the past 4.  Chest pain on inspiration uncertain etiology.  We will try GI cocktail and PPI.  We will get a  chest x-ray today.  We will delay his discharge until tomorrow.  Continue ambulation and hold today.   Signed, Cassell Clementhomas Roylene Heaton MD

## 2014-10-21 ENCOUNTER — Telehealth: Payer: Self-pay | Admitting: Cardiology

## 2014-10-21 MED ORDER — FUROSEMIDE 20 MG PO TABS: 20.0000 mg | ORAL_TABLET | Freq: Every day | ORAL | Status: AC

## 2014-10-21 MED ORDER — NITROGLYCERIN 0.4 MG SL SUBL: 0.4000 mg | SUBLINGUAL_TABLET | SUBLINGUAL | Status: AC | PRN

## 2014-10-21 MED ORDER — HYDRALAZINE HCL 25 MG PO TABS: 25.0000 mg | ORAL_TABLET | Freq: Three times a day (TID) | ORAL | Status: AC

## 2014-10-21 MED ORDER — CLOPIDOGREL BISULFATE 75 MG PO TABS: 75.0000 mg | ORAL_TABLET | Freq: Every day | ORAL | Status: AC

## 2014-10-21 MED ORDER — ASPIRIN 81 MG PO CHEW: 81.0000 mg | CHEWABLE_TABLET | Freq: Every day | ORAL | Status: DC

## 2014-10-21 MED ORDER — ATORVASTATIN CALCIUM 80 MG PO TABS: 80.0000 mg | ORAL_TABLET | Freq: Every day | ORAL | Status: AC

## 2014-10-21 MED ORDER — POTASSIUM CHLORIDE CRYS ER 20 MEQ PO TBCR
20.0000 meq | EXTENDED_RELEASE_TABLET | Freq: Every day | ORAL | Status: DC
  Administered 2014-10-21: 20 meq via ORAL
  Filled 2014-10-21: qty 1

## 2014-10-21 MED ORDER — CARVEDILOL 6.25 MG PO TABS: 6.2500 mg | ORAL_TABLET | Freq: Two times a day (BID) | ORAL | Status: AC

## 2014-10-21 MED ORDER — ISOSORBIDE MONONITRATE ER 60 MG PO TB24: 60.0000 mg | ORAL_TABLET | Freq: Every day | ORAL | Status: AC

## 2014-10-21 MED ORDER — FUROSEMIDE 20 MG PO TABS
20.0000 mg | ORAL_TABLET | Freq: Every day | ORAL | Status: DC
  Administered 2014-10-21: 20 mg via ORAL
  Filled 2014-10-21: qty 1

## 2014-10-21 MED ORDER — LOSARTAN POTASSIUM 25 MG PO TABS: 25.0000 mg | ORAL_TABLET | Freq: Every day | ORAL | Status: AC

## 2014-10-21 MED ORDER — POTASSIUM CHLORIDE CRYS ER 20 MEQ PO TBCR: 20.0000 meq | EXTENDED_RELEASE_TABLET | Freq: Every day | ORAL | Status: AC

## 2014-10-21 NOTE — Discharge Summary (Signed)
Physician Discharge Summary  Patient ID: Steve Jackson MRN: [id number] DOB/AGE: 58/02/1956 58 y.o.   Primary Cardiologist: Dr. SwazilandJordan  Admit date: 10/18/2014 Discharge date: 10/21/2014  Admission Diagnoses: NSTEMI  Discharge Diagnoses:  Principal Problem:   NSTEMI (non-ST elevated myocardial infarction) Active Problems:   HTN (hypertension)   Tobacco use   Hernia, inguinal   Cardiomyopathy, EF 25% at cath   S/P arterial stent, BMS to RCA, 10/18/14   CAD, multiple vessel   Discharged Condition: stable  Hospital Course: The patient is a 59 y/o male with a past history of untreated HTN and tobacco use but no prior cardiac history, who initially presented to [facility] on 10/18/14 with a 3 day history of chest pain. Initial EKG was noted to be mildly abnormal with LVH. However, troponin was severely elevated at 23. Subsequently, [facility] was [facility] The procedure was performed by Dr. SwazilandJordan. He was found to have 2 vessel obstructive CAD. There was chronic occlusion of the first diagonal as well as an acute lesion in the mid RCA. He underwent successful stenting of the mid RCA with a BMS. Left ventriculography revealed severe LV dysfunction, out of proportion to his CAD. EF was 25% by cath and 30-35% by echo. It was felt that his low EF was a result of hypertensive cardiomyopathy. He tolerated the procedure well and left the cath lab in stable condition. His CP resolved. He was initially placed on DAPT with ASA + Brilinta, however due to medication induced dyspnea from Brilinta, he was changed to Plavix. His dyspnea resolved after discontinuing Brilinta. In addition to DAPT, he was placed on high dose statin, and a BB, ARB, and hydralazine + nitrate for his LV dysfunction. A low dose diuretic + K supplement was also added. His BP, HR and renal function all remained stable. He had no post cath complications. His cath access site was stable. He had no difficulties  ambulating with cardiac rehab. He was last seen and examined by Dr. Patty SermonsBrackbill who determined he was stable for discharge home. 10 day TOC f/u has been arranged for 10/29/14 with Tereso NewcomerScott Weaver, PA-C. At time of f/u, he will need a repeat EKG and BMET. After initial post-hospital [facility], the patient plans on being followed by a cardiologist and enrolling in cardiac rehab in [place], where [place] resides. All of his medications were e-prescribed along with PRN SL NTG.  Smoking cessation was strongly advised.   Consults: None  Significant Diagnostic Studies:   LHC 10/18/14 PROCEDURAL FINDINGS Hemodynamics: AO 151/103 mean 125 mm Hg LV 143/37 mm Hg  Coronary angiography: Coronary dominance: right  Left mainstem: 20% diffuse disease.   Left anterior descending (LAD): There is 20-30% disease in the proximal vessel. The first diagonal has a high take off and is occluded with late left to left collaterals.   Left circumflex (LCx): Diffuse irregularities in the proximal to mid vessel up to 30%.   Right coronary artery (RCA): Dominant vessel. There is a segmental 95% stenosis in the mid vessel with TIMI2 flow.   Left ventriculography: Left ventricular systolic function is abnormal. There is akinesis in the mid anterior and basal to mid inferior wall. There is severe global hypokinesis. LVEF is estimated at 25%, there is no significant mitral regurgitation    2D Echo 10/19/14 Study Conclusions  - Left ventricle: There is akinesis of the basal and mid inferior, inferolateral, anterolateral walls and apical lateral and inferior walls. The cavity  size was mildly dilated. There was severe concentric hypertrophy. Systolic function was moderately to severely reduced. The estimated ejection fraction was in the range of 30% to 35%. Wall motion was normal; there were no regional wall motion abnormalities. Features are consistent with a pseudonormal left  ventricular filling pattern, with concomitant abnormal relaxation and increased filling pressure (grade 2 diastolic dysfunction). Doppler parameters are consistent with elevated ventricular end-diastolic filling pressure. - Aortic valve: Trileaflet; normal thickness leaflets. There was no regurgitation. - Aortic root: The aortic root was normal in size. - Mitral valve: Structurally normal valve. Transvalvular velocity was within the normal range. There was no evidence for stenosis. There was mild regurgitation. - Left atrium: The atrium was mildly dilated. - Right ventricle: Systolic function was normal. - Right atrium: The atrium was normal in size. - Tricuspid valve: There was mild regurgitation. - Pulmonary arteries: Systolic pressure was within the normal range. - Inferior vena cava: The vessel was normal in size.   Treatments: See Hospital   Discharge Exam: Blood pressure 106/64, pulse 70, temperature 98.3 F (36.8 C), temperature source Oral, resp. rate 29, height  (1.753 m), weight 207 lb 7.3 oz (94.1 kg), SpO2 93 %.   Disposition: Final discharge disposition not confirmed      Discharge Instructions    Amb Referral to Cardiac Rehabilitation    Complete by:  As directed   Congestive Heart Failure: If diagnosis is Heart Failure, patient MUST meet each of the CMS criteria: 1. Left Ventricular Ejection Fraction </= 35% 2. NYHA class II-IV symptoms despite being on optimal heart failure therapy for at least 6 weeks. 3. Stable = have not had a recent (<6 weeks) or planned (<6 months) major cardiovascular hospitalization or procedure  Program Details: - Physician supervised classes - 1-3 classes per week over a 12-18 week period, generally for a total of 36 sessions  Physician Certification: I certify that the above Cardiac Rehabilitation treatment is medically necessary and is medically approved by me for treatment of this patient. The patient is  willing and cooperative, able to ambulate and medically stable to participate in exercise rehabilitation. The participant's progress and Individualized Treatment Plan will be reviewed by the Medical Director, Cardiac Rehab staff and as indicated by the Referring/Ordering Physician.  Diagnosis:   Myocardial Infarction PCI       Diet - low sodium heart healthy    Complete by:  As directed      Increase activity slowly    Complete by:  As directed             Medication List    TAKE these medications        aspirin 81 MG chewable tablet  Chew 1 tablet (81 mg total) by mouth daily.     atorvastatin 80 MG tablet  Commonly known as:  LIPITOR  Take 1 tablet (80 mg total) by mouth daily at 6 PM.     carvedilol 6.25 MG tablet  Commonly known as:  COREG  Take 1 tablet (6.25 mg total) by mouth 2 (two) times daily with a meal.     clopidogrel 75 MG tablet  Commonly known as:  PLAVIX  Take 1 tablet (75 mg total) by mouth daily.     furosemide 20 MG tablet  Commonly known as:  LASIX  Take 1 tablet (20 mg total) by mouth daily.     hydrALAZINE 25 MG tablet  Commonly known as:  APRESOLINE  Take 1 tablet (25 mg  total) by mouth every 8 (eight) hours.     isosorbide mononitrate 60 MG 24 hr tablet  Commonly known as:  IMDUR  Take 1 tablet (60 mg total) by mouth daily.     losartan 25 MG tablet  Commonly known as:  COZAAR  Take 1 tablet (25 mg total) by mouth daily.     nitroGLYCERIN 0.4 MG SL tablet  Commonly known as:  NITROSTAT  Place 1 tablet (0.4 mg total) under the tongue every 5 (five) minutes as needed for chest pain.     potassium chloride SA 20 MEQ tablet  Commonly known as:  K-DUR,KLOR-CON  Take 1 tablet (20 mEq total) by mouth daily.       Follow-up Information    Follow up with Tereso Newcomer, PA-C On 10/29/2014.   Specialty:  Physician Assistant   Why:  11:00 am (Dr. Elvis Coil PA)    Contact information:   [address] [place]  [address] [phone]     TIME SPENT ON DISCHARGE, INCLUDING PHYSICIAN TIME: >30 MINUTES  Signed: Robbie Lis 10/21/2014, 11:34 AM

## 2014-10-21 NOTE — Progress Notes (Signed)
Patient Name: Steve Jackson Date of Encounter: 10/21/2014     Principal Problem:   NSTEMI (non-ST elevated myocardial infarction) Active Problems:   HTN (hypertension)   Tobacco use   Hernia, inguinal   Cardiomyopathy, EF 25% at cath   S/P arterial stent, BMS to RCA, 10/18/14   CAD, multiple vessel    SUBJECTIVE the patient feels much better today.  He is anxious to go home.  Yesterday he was switched from Brilinta to Plavix.  He had been experiencing dyspnea on Brilinta.  He says that when he switched to Plavix the dyspnea improved significantly.  He has only very minimal residual increased lower chest discomfort with deep inspiration.  He is tolerating ambulation without difficulty.  His rhythm has been normal sinus rhythm without significant arrhythmia.  CURRENT MEDS . aspirin  81 mg Oral Daily  . atorvastatin  80 mg Oral q1800  . carvedilol  6.25 mg Oral BID WC  . clopidogrel  75 mg Oral Daily  . furosemide  40 mg Oral Daily  . heparin  5,000 Units Subcutaneous 3 times per day  . hydrALAZINE  25 mg Oral 3 times per day  . isosorbide mononitrate  60 mg Oral Daily  . losartan  25 mg Oral Daily  . nicotine  14 mg Transdermal Daily  . pantoprazole  40 mg Oral Daily  . pneumococcal 23 valent vaccine  0.5 mL Intramuscular Tomorrow-1000    OBJECTIVE  Filed Vitals:   10/21/14 0400 10/21/14 0500 10/21/14 0600 10/21/14 0700  BP: 119/62 113/64 114/65 120/77  Pulse: 82 80 72 75  Temp: 98.2 F (36.8 C)     TempSrc: Oral     Resp: 0  Height:      Weight:      SpO2: 98% 96% 97% 92%    Intake/Output Summary (Last 24 hours) at 10/21/14 0746 Last data filed at 10/21/14 0100  Gross per 24 hour  Intake    960 ml  Output   1170 ml  Net   -210 ml   Filed Weights   10/18/14 2000  Weight: 207 lb 7.3 oz (94.1 kg)    PHYSICAL EXAM  General: Pleasant, NAD. Neuro: Alert and oriented X 3. Moves all extremities spontaneously. Psych: Normal affect. HEENT:   Normal  Neck: Supple without bruits or JVD. Lungs:  Resp regular and unlabored, CTA. Heart: RRR no s3, s4, or murmurs.  Auscultation reveals no pericardial rub.  He is not tender to palpation Abdomen: Soft, non-tender, non-distended, BS + x 4.  Extremities: No clubbing, cyanosis or edema. DP/PT/Radials 2+ and equal bilaterally.  Accessory Clinical Findings  CBC  Recent Labs  10/18/14 2053 10/19/14 0222  WBC 12.9* 11.7*  HGB 16.5 16.9  HCT 51.0 51.7  MCV 82.9 84.6  PLT 212 198   Basic Metabolic Panel  Recent Labs  10/18/14 2053 10/19/14 0222 10/20/14 0246  NA  --  136 135  K  --  4.8 3.5  CL  --  101 97  CO2  --  25 27  GLUCOSE  --  122* 91  BUN  --  17 15  CREATININE 1.32 1.30 1.32  CALCIUM  --  8.3* 8.3*  MG 1.8  --   --    Liver Function Tests  Recent Labs  10/19/14 0222  AST 89*  ALT 31  ALKPHOS 68  BILITOT 1.9*  PROT 6.9  ALBUMIN 2.4*   No results for input(s): LIPASE, AMYLASE in  the last 72 hours. Cardiac Enzymes No results for input(s): CKTOTAL, CKMB, CKMBINDEX, TROPONINI in the last 72 hours. BNP Invalid input(s): POCBNP D-Dimer No results for input(s): DDIMER in the last 72 hours. Hemoglobin A1C  Recent Labs  10/18/14 2053  HGBA1C 5.6   Fasting Lipid Panel No results for input(s): CHOL, HDL, LDLCALC, TRIG, CHOLHDL, LDLDIRECT in the last 72 hours. Thyroid Function Tests  Recent Labs  10/18/14 2053  TSH 2.106    TELE  Normal sinus rhythm  ECG 20-Oct-2014 06:32:47 [facility] ROUTINE RECORD Normal sinus rhythm Rightward axis Cannot rule out Inferior infarct , age undetermined Abnormal ECG  Radiology/Studies  Dg Chest 2 View  10/20/2014   CLINICAL DATA:  Chest pain, shortness of breath  EXAM: CHEST  2 VIEW  COMPARISON:  None.  FINDINGS: Mild patchy left basilar opacity, likely atelectasis. No focal consolidation. No pleural effusion or pneumothorax.  Cardiomegaly.  Visualized osseous structures are  within normal limits.  IMPRESSION: No evidence of acute cardiopulmonary disease.   Electronically Signed   By: Charline BillsSriyesh  Krishnan M.D.   On: 10/20/2014 15:28  2D Echo: - Left ventricle: There is akinesis of the basal and mid inferior, inferolateral, anterolateral walls and apical lateral and inferior walls. The cavity size was mildly dilated. There was severe concentric hypertrophy. Systolic function was moderately to severely reduced. The estimated ejection fraction was in the range of 30% to 35%. Wall motion was normal; there were no regional wall motion abnormalities. Features are consistent with a pseudonormal left ventricular filling pattern, with concomitant abnormal relaxation and increased filling pressure (grade 2 diastolic dysfunction). Doppler parameters are consistent with elevated ventricular end-diastolic filling pressure. - Aortic valve: Trileaflet; normal thickness leaflets. There was no regurgitation. - Aortic root: The aortic root was normal in size. - Mitral valve: Structurally normal valve. Transvalvular velocity was within the normal range. There was no evidence for stenosis. There was mild regurgitation. - Left atrium: The atrium was mildly dilated. - Right ventricle: Systolic function was normal. - Right atrium: The atrium was normal in size. - Tricuspid valve: There was mild regurgitation. - Pulmonary arteries: Systolic pressure was within the normal range. - Inferior vena cava: The vessel was normal in size. ASSESSMENT AND PLAN  1.  Non-STEMI, status post stenting of the mid RCA artery with a bare metal stent. 2.  Left ventricular systolic dysfunction which is out of proportion to his coronary disease and suggests a component of hypertensive cardiomyopathy.  Two-dimensional echo shows significant LVH with concentric hypertrophy felt to be secondary to untreated long-standing hypertension 3.  History of poor compliance in the past 4.   Dyspnea felt to be secondary to Brilinta.  Dyspnea has resolved on Plavix  Plan: Okay for discharge home today.  We will want him to return for follow-up office visit in 2 weeks at which time a follow-up EKG and BMET should be obtained.  He should be kept out of work until after his return office visit.  He intends to subsequently be followed up in [place] by a cardiologist there.  He will also be participating in [facility]. The importance of not resuming smoking was emphasized to the patient today Signed, Cassell Clementhomas Kaydi Kley MD

## 2014-10-21 NOTE — Telephone Encounter (Signed)
Message recieved

## 2014-10-21 NOTE — Telephone Encounter (Signed)
10 day TOC patient per Brittainy-- appt 10-29-14 with Scott at 1100a

## 2014-10-22 ENCOUNTER — Telehealth: Payer: Self-pay | Admitting: Cardiology

## 2014-10-22 NOTE — Telephone Encounter (Signed)
Patient needs a TOC phone Call . Thanks

## 2014-10-22 NOTE — Telephone Encounter (Signed)
TOC call to patient he stated he was dong good except he continues to have pain in left shoulder.Stated he takes Tylenol with relief.Patient understands discharge instructions.Patient is taking medications as prescribed.Advised to keep post hospital appointment with Tereso NewcomerScott Weaver PA 10/29/14 at 11:00 am [facility]

## 2014-10-29 ENCOUNTER — Encounter: Payer: BLUE CROSS/BLUE SHIELD | Admitting: Physician Assistant

## 2014-10-29 NOTE — Progress Notes (Signed)
Cardiology Office Note   Date:  10/29/2014   ID:  Steve ConstableShelby Jackson, DOB 06/09/1956, MRN [id number]  PCP:  No PCP Per Patient  Cardiologist:  Dr. Peter SwazilandJordan     Chief Complaint  Patient presents with  . Hospitalization Follow-up    s/p NSTEMI >> BMS to RCA  . Coronary Artery Disease  . Cardiomyopathy     History of Present Illness: Steve Jackson is a 59 y.o. male with a hx of untreated HTN, Tobacco abuse.  Admitted 4/25-4/28 with a NSTEMI.  [facility] with 3 days of chest pain.  Troponin was 23. [facility] and underwent LHC that demonstrated 2v CAD with CTO of D1 and high grade lesion in the mid RCA.  The mid RCA was treated with a BMS.  EF was 25% by cath and 30-35% by echo.  Low EF was out of proportion to amount of CAD and was felt to be related to HTN cardiomyopathy.  Brilinta was changed to Plavix 2/2 dyspnea.  He was initiated on high-dose statin, beta blocker, ARB, hydralazine and nitrates. Patient will attend cardiac rehabilitation in [place].  He returns for FU.       Studies/Reports Reviewed Today:  LHC 10/18/14 Left mainstem: 20% diffuse disease.   Left anterior descending (LAD): There is 20-30% disease in the proximal vessel. The first diagonal has a high take off and is occluded with late left to left collaterals.   Left circumflex (LCx): Diffuse irregularities in the proximal to mid vessel up to 30%.   Right coronary artery (RCA): Dominant vessel. There is a segmental 95% stenosis in the mid vessel with TIMI2 flow.   Left ventriculography: Left ventricular systolic function is abnormal. There is akinesis in the mid anterior and basal to mid inferior wall. There is severe global hypokinesis.  LVEF is estimated at 25%, there is no significant mitral regurgitation  PCI Data:  RCA/Segment - mid Stent 3.0 x 23 mm Vision  Echo 10/19/14 - AK of the basal and mid inferior, inferolateral, anterolateral walls and apical lateral  and inferior walls. The cavity size was mildly dilated. There was severe concentric hypertrophy. Systolic function was moderately to severely reduced. EF 30% to 35%. Grade 2 diastolic dysfunction - Aortic valve: Trileaflet; normal thickness leaflets. There was no regurgitation. - Aortic root: The aortic root was normal in size. - Mitral valve: Structurally normal valve. Transvalvular velocity was within the normal range. There was no evidence for stenosis. There was mild regurgitation. - Left atrium: The atrium was mildly dilated. - Right ventricle: Systolic function was normal. - Right atrium: The atrium was normal in size. - Tricuspid valve: There was mild regurgitation. - Pulmonary arteries: Systolic pressure was within the normal range. - Inferior vena cava: The vessel was normal in size.   Past Medical History  Diagnosis Date  . HTN (hypertension) 10/18/2014  . Tobacco use 10/18/2014  . Hernia, inguinal 10/18/2014  . Cardiomyopathy, EF 25% at cath 10/18/2014  . S/P arterial stent, BMS to RCA, 10/18/14 10/18/2014    Past Surgical History  Procedure Laterality Date  . Inguinla hernia      repair on Rt.  . Left heart cath N/A 10/18/2014    Procedure: LEFT HEART CATH;  Surgeon: Peter M SwazilandJordan, MD;  [facility];  Service: Cardiovascular;  Laterality: N/A;     Current Outpatient Prescriptions  Medication Sig Dispense Refill  . aspirin 81 MG chewable tablet Chew 1  tablet (81 mg total) by mouth daily.    Marland Kitchen. atorvastatin (LIPITOR) 80 MG tablet Take 1 tablet (80 mg total) by mouth daily at 6 PM. 30 tablet 5  . carvedilol (COREG) 6.25 MG tablet Take 1 tablet (6.25 mg total) by mouth 2 (two) times daily with a meal. 60 tablet 5  . clopidogrel (PLAVIX) 75 MG tablet Take 1 tablet (75 mg total) by mouth daily. 30 tablet 11  . furosemide (LASIX) 20 MG tablet Take 1 tablet (20 mg total) by mouth daily. 30 tablet 5  . hydrALAZINE (APRESOLINE) 25 MG tablet Take 1 tablet (25 mg total) by  mouth every 8 (eight) hours. 90 tablet 5  . isosorbide mononitrate (IMDUR) 60 MG 24 hr tablet Take 1 tablet (60 mg total) by mouth daily. 30 tablet 5  . losartan (COZAAR) 25 MG tablet Take 1 tablet (25 mg total) by mouth daily. 30 tablet 5  . nitroGLYCERIN (NITROSTAT) 0.4 MG SL tablet Place 1 tablet (0.4 mg total) under the tongue every 5 (five) minutes as needed for chest pain. 25 tablet 2  . potassium chloride SA (K-DUR,KLOR-CON) 20 MEQ tablet Take 1 tablet (20 mEq total) by mouth daily. 30 tablet 5   No current facility-administered medications for this visit.    Allergies:   Review of patient's allergies indicates no known allergies.    Social History:  The patient  reports that he has been smoking.  He has never used smokeless tobacco. He reports that he drinks about 12.6 oz of alcohol per week. He reports that he does not use illicit drugs.   Family History:  The patient's family history includes Heart disease in his mother.    ROS:   Please see the history of present illness.   ROS    PHYSICAL EXAM: VS:  There were no vitals taken for this visit.    Wt Readings from Last 3 Encounters:  10/18/14 207 lb 7.3 oz (94.1 kg)     GEN: Well nourished, well developed, in no acute distress HEENT: normal Neck: no JVD, no carotid bruits, no masses Cardiac:  Normal S1/S2, RRR; no murmur ,  no rubs or gallops, no edema  Respiratory:  clear to auscultation bilaterally, no wheezing, rhonchi or rales. GI: soft, nontender, nondistended, + BS MS: no deformity or atrophy Skin: warm and dry  Neuro:  CNs II-XII intact, Strength and sensation are intact Psych: Normal affect   EKG:  EKG is ordered today.  It demonstrates:      Recent Labs: 10/18/2014: Magnesium 1.8; TSH 2.106 10/19/2014: ALT 31; Hemoglobin 16.9; Platelets 198 10/20/2014: BUN 15; Creatinine 1.32; Potassium 3.5; Sodium 135    Lipid Panel No results found for: CHOL, TRIG, HDL, CHOLHDL, VLDL, LDLCALC, LDLDIRECT     ASSESSMENT AND PLAN:  Coronary artery disease involving native coronary artery of native heart without angina pectoris  Dilated cardiomyopathy  Essential hypertension  Hyperlipidemia  Tobacco use    Current medicines are reviewed at length with the patient today.  Concerns regarding medicines are as outlined above.  The following changes have been made:       Labs/ tests ordered today include:  No orders of the defined types were placed in this encounter.    Disposition:   FU with    Signed, Tereso NewcomerScott Weaver, PA-C, MHS 10/29/2014 7:58 AM    [facility] [address], [place], [place]  [address] Phone: [phone]; Fax: [phone]    This encounter  was created in error - please disregard.

## 2014-11-25 ENCOUNTER — Other Ambulatory Visit: Payer: Self-pay | Admitting: Cardiology

## 2014-11-25 NOTE — Telephone Encounter (Signed)
Rx(s) sent to pharmacy electronically.  

## 2014-12-24 DEATH — deceased

## 2016-03-24 IMAGING — CR DG CHEST 2V
2 series · 2 of 2 positions shown · non-contrast
Comparison: None.

CLINICAL DATA: Chest pain, shortness of breath

EXAM:
CHEST  2 VIEW

[chest lat]
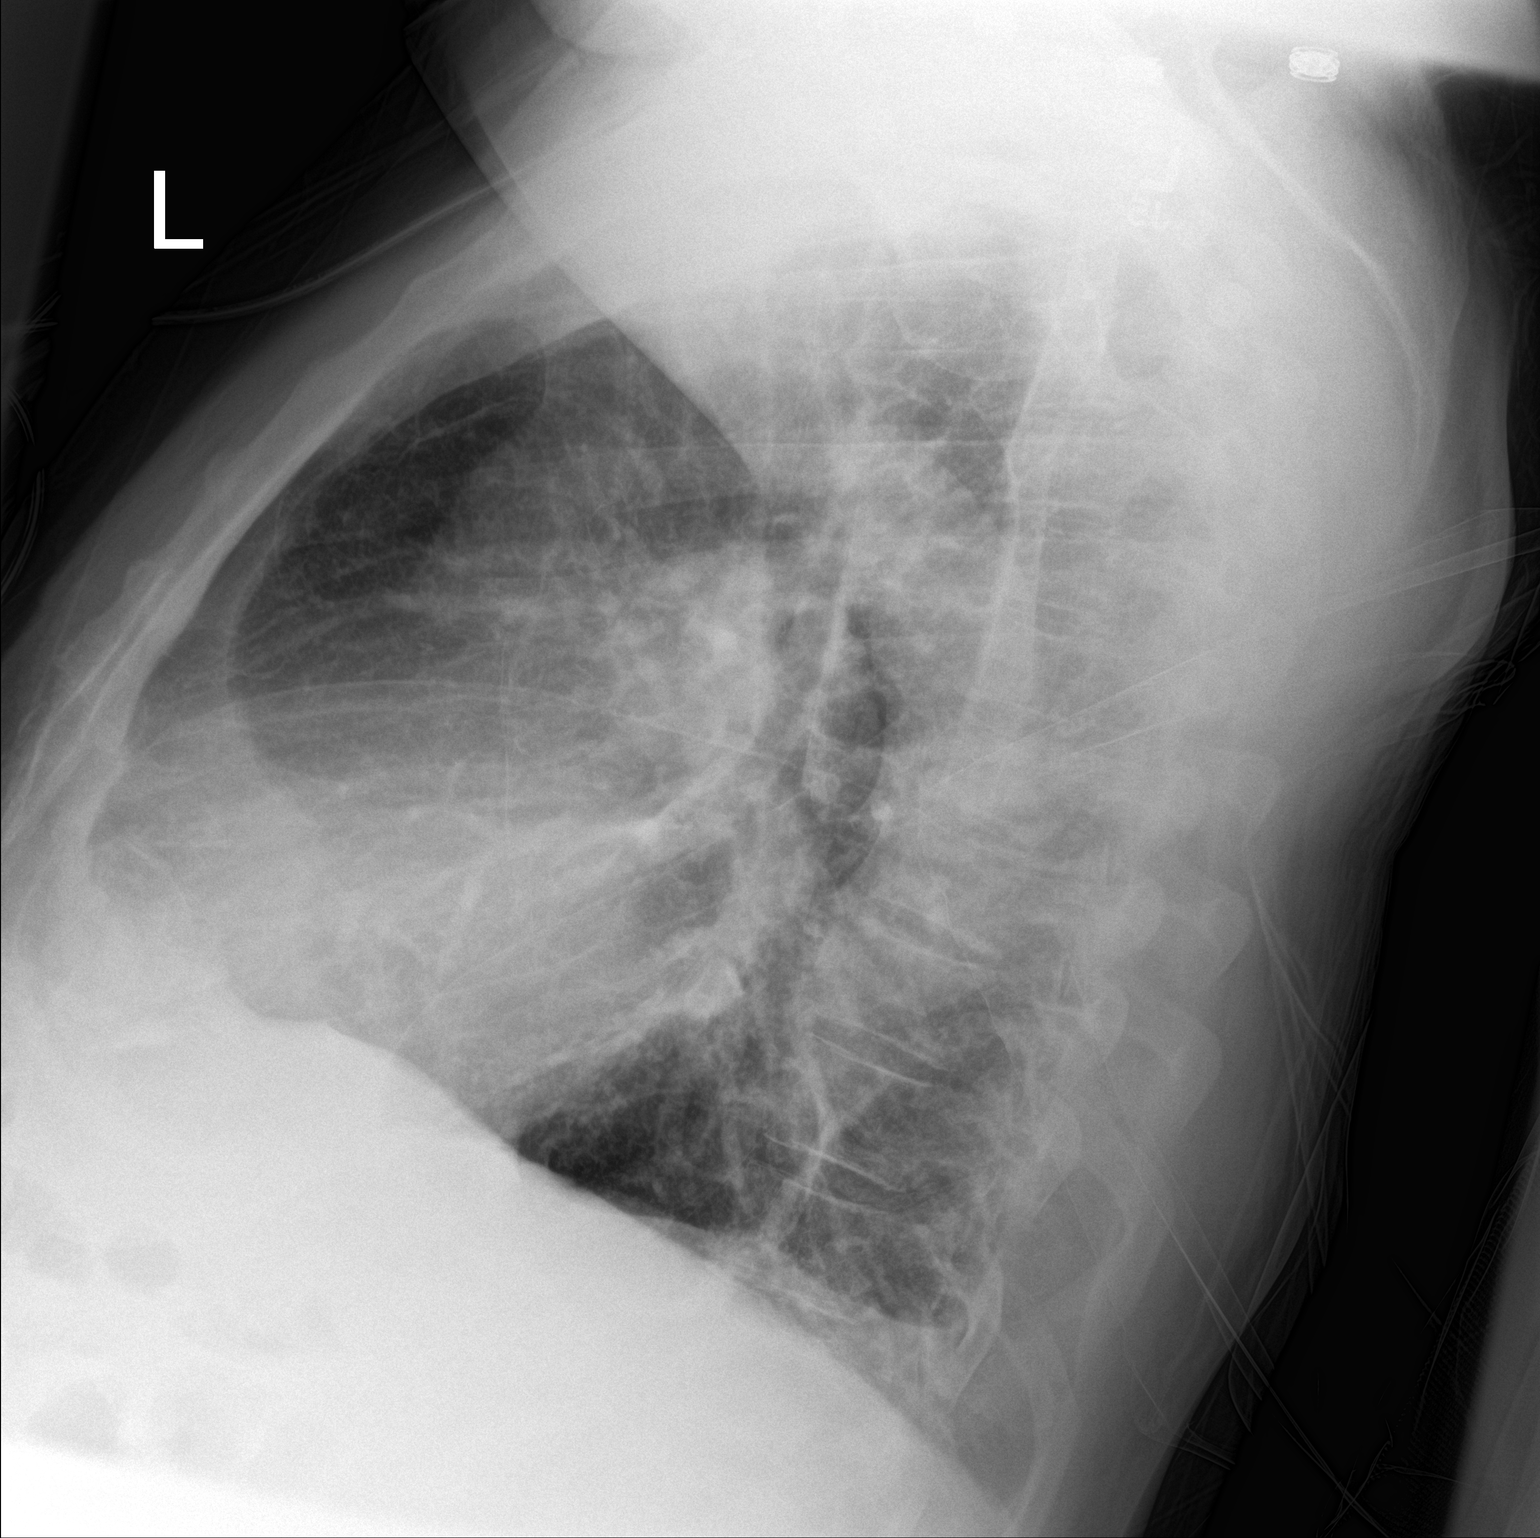

[chest ap]
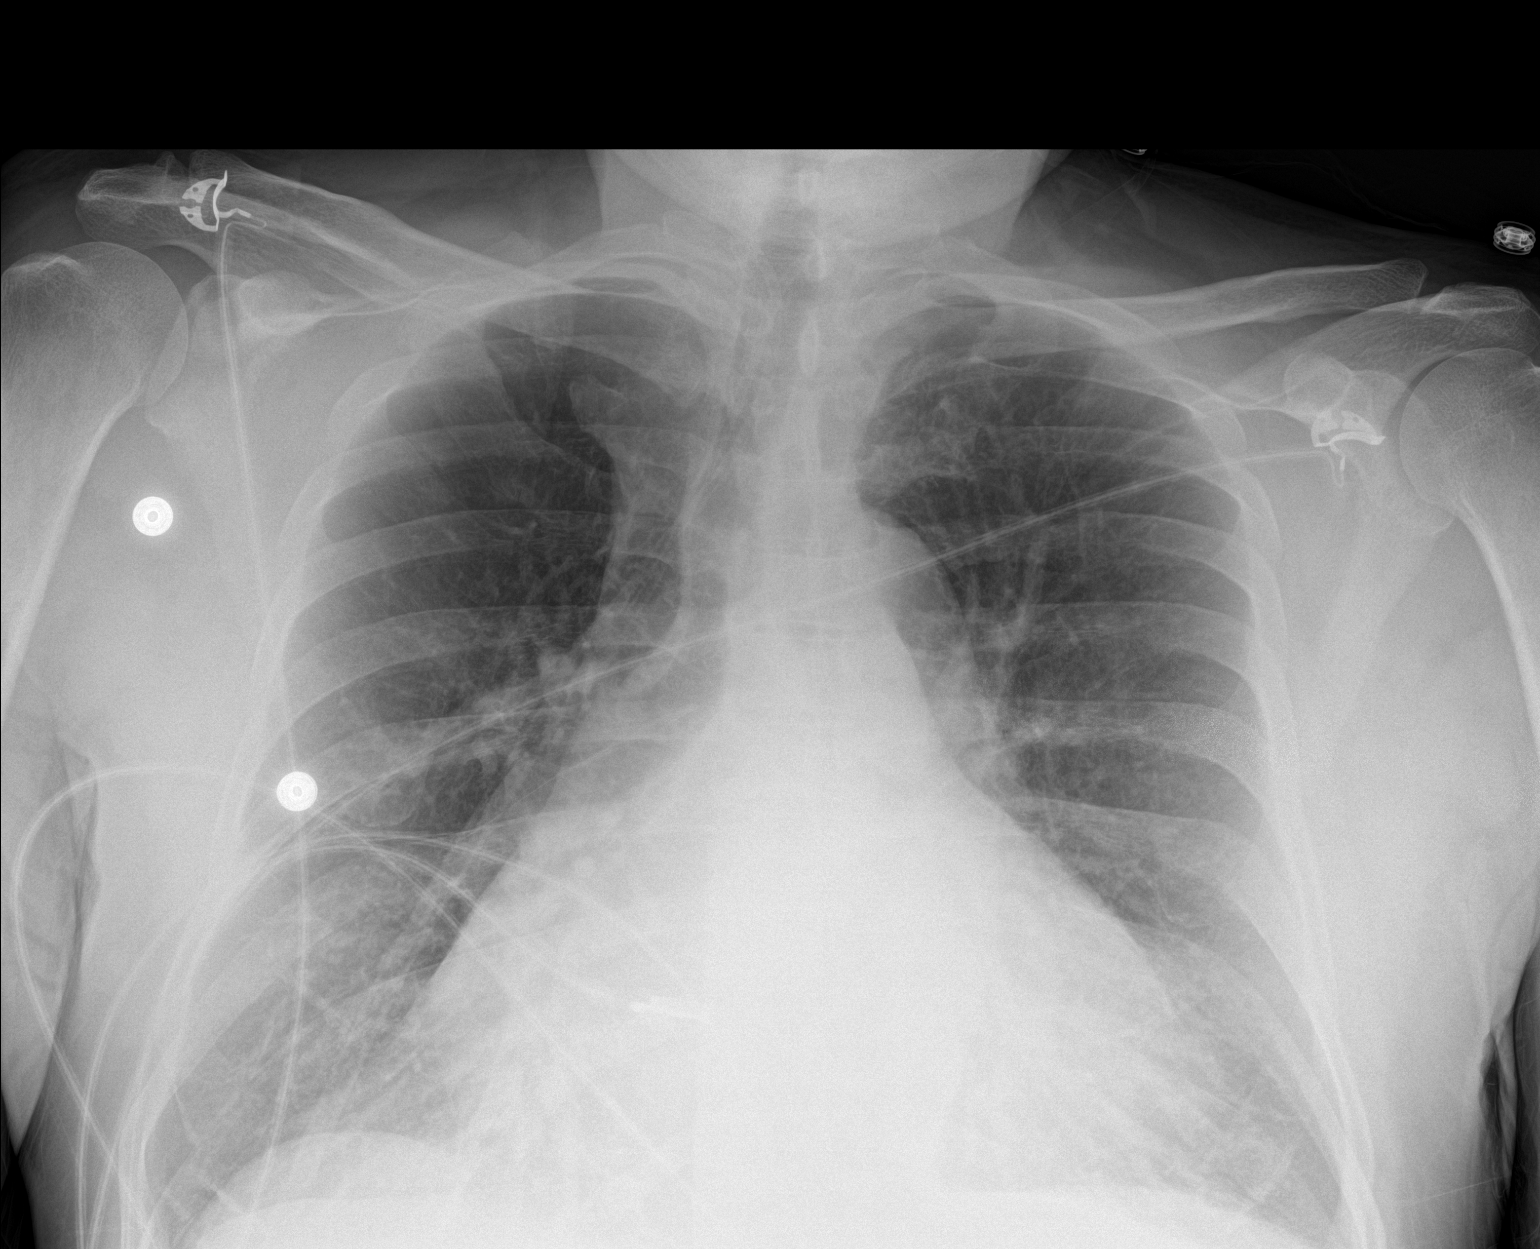

[2 of 2 positions shown; findings below may reference images not displayed]

FINDINGS: Mild patchy left basilar opacity, likely atelectasis. No focal
consolidation. No pleural effusion or pneumothorax.

Cardiomegaly.

Visualized osseous structures are within normal limits.
IMPRESSION: No evidence of acute cardiopulmonary disease.
